# Patient Record
Sex: Female | Born: 1980 | Race: Black or African American | Hispanic: No | Marital: Married | Smoking: Never smoker
Health system: Southern US, Community
[De-identification: ages and names within clinical notes are randomized; demographics above are authoritative.]

## PROBLEM LIST (undated history)

## (undated) HISTORY — PX: TUBAL LIGATION: SHX77

---

## 2009-03-07 ENCOUNTER — Emergency Department: Payer: Self-pay | Admitting: Emergency Medicine

## 2009-08-06 ENCOUNTER — Ambulatory Visit: Payer: Self-pay | Admitting: Internal Medicine

## 2009-12-17 ENCOUNTER — Emergency Department: Payer: Self-pay | Admitting: Emergency Medicine

## 2010-04-18 ENCOUNTER — Ambulatory Visit: Payer: Self-pay | Admitting: Family Medicine

## 2010-10-05 ENCOUNTER — Ambulatory Visit: Payer: Self-pay

## 2016-09-13 ENCOUNTER — Encounter: Payer: Self-pay | Admitting: Emergency Medicine

## 2016-09-13 ENCOUNTER — Ambulatory Visit
Admission: EM | Admit: 2016-09-13 | Discharge: 2016-09-13 | Disposition: A | Discharge status: Home / Self Care | Payer: BLUE CROSS/BLUE SHIELD | Attending: Family Medicine | Admitting: Family Medicine

## 2016-09-13 ENCOUNTER — Ambulatory Visit (INDEPENDENT_AMBULATORY_CARE_PROVIDER_SITE_OTHER): Payer: BLUE CROSS/BLUE SHIELD

## 2016-09-13 DIAGNOSIS — S86312A Strain of muscle(s) and tendon(s) of peroneal muscle group at lower leg level, left leg, initial encounter: Secondary | ICD-10-CM | POA: Diagnosis not present

## 2016-09-13 DIAGNOSIS — M25572 Pain in left ankle and joints of left foot: Secondary | ICD-10-CM | POA: Diagnosis not present

## 2016-09-13 MED ORDER — NAPROXEN 500 MG PO TABS: 500.0000 mg | ORAL_TABLET | Freq: Two times a day (BID) | ORAL | 0 refills | Status: DC

## 2016-09-13 NOTE — ED Provider Notes (Signed)
CSN: [id number]     Arrival date & time 09/13/16  1536 History   First MD Initiated Contact with Patient 09/13/16 1611     Chief Complaint  Patient presents with  . Foot Pain   (Consider location/radiation/quality/duration/timing/severity/associated sxs/prior Treatment) HPI  This is a 36 year old female who has left lateral ankle pain and swelling that she's had for a few days. She states that she was sitting on the couch and just stood up and felt a pop that was painful over the lateral ankle. She states that now is very painful to walk on her foot. She has been elevating and icing but did still tends to swell and also remains painful. She does not remember any previous injury nor has she had increased activities  with running, prolonged standing, long walking, or increasing workout in the gym.         History reviewed. No pertinent past medical history. Past Surgical History:  Procedure Laterality Date  . TUBAL LIGATION Bilateral    Family History  Problem Relation Age of Onset  . Cancer Father   . Hypertension Mother    Social History  Substance Use Topics  . Smoking status: Never Smoker  . Smokeless tobacco: Never Used  . Alcohol use No   OB History    No data available     Review of Systems  Constitutional: Positive for activity change. Negative for appetite change, fatigue and fever.  Musculoskeletal: Positive for gait problem and myalgias.  All other systems reviewed and are negative.   Allergies  Sulfa antibiotics  Home Medications   Prior to Admission medications   Medication Sig Start Date End Date Taking? Authorizing Provider  naproxen (NAPROSYN) 500 MG tablet Take 1 tablet (500 mg total) by mouth 2 (two) times daily with a meal. 09/13/16   Lutricia Feiloemer, Berdie Malter P, PA-C   Meds Ordered and Administered this Visit  Medications - No data to display  BP 117/62 (BP Location: Left Arm)   Pulse 81   Temp 98.2 F (36.8 C) (Oral)   Resp 18   Ht 5\' 2"  (1.575 m)    Wt 141 lb 1.5 oz (64 kg)   LMP 08/29/2016 (Approximate)   SpO2 100%   BMI 25.81 kg/m  No data found.   Physical Exam  Constitutional: She is oriented to person, place, and time. She appears well-developed and well-nourished. No distress.  HENT:  Head: Normocephalic.  Eyes: Pupils are equal, round, and reactive to light.  Neck: Normal range of motion.  Musculoskeletal: She exhibits edema and tenderness. She exhibits no deformity.  Examination of the left foot and ankle shows no discomfort with compression of the distal tib-fib. Patient does not have any tenderness on the medial aspect of her ankle along the tarsals or metatarsals. However laterally the patient has pain beginning just superior to the lateral malleolus posteriorly along the malleolus and extending into the base of the fifth metatarsal. There is swelling present. There is no ecchymosis present. She also has tenderness over the distal fibula. Range of motion is decreased particularly to eversion inversion at the extremes plantar flexion and dorsiflexion all which cause of lateral ankle pain. Motion of the subtalar joint to eversion does cause her discomfort.  Neurological: She is alert and oriented to person, place, and time.  Skin: Skin is warm and dry. She is not diaphoretic.  Psychiatric: She has a normal mood and affect. Her behavior is normal. Judgment and thought content normal.  Nursing note and  vitals reviewed.   Urgent Care Course     Procedures (including critical care time)  Labs Review Labs Reviewed - No data to display  Imaging Review Dg Ankle Complete Left  Result Date: 09/13/2016 CLINICAL DATA:  Patient states foot popped a few days ago and is swollen and painful to the touch. EXAM: LEFT ANKLE COMPLETE - 3+ VIEW COMPARISON:  None. FINDINGS: There is no evidence of fracture, dislocation, or joint effusion. Lateral soft tissue swelling swelling. Ankle mortise is intact. Visualized bones of the foot  demonstrate no acute abnormality. IMPRESSION: Lateral soft tissue swelling.  No visible fracture. Electronically Signed   By: Elsie Stain M.D.   On: 09/13/2016 16:58     Visual Acuity Review  Right Eye Distance:   Left Eye Distance:   Bilateral Distance:    Right Eye Near:   Left Eye Near:    Bilateral Near:     Patient was given a boot orthosis which helped with her ambulation immensely    MDM   1. Strain of peroneal tendon of left foot, initial encounter    New Prescriptions   NAPROXEN (NAPROSYN) 500 MG TABLET    Take 1 tablet (500 mg total) by mouth 2 (two) times daily with a meal.  Plan: 1. Test/x-ray results and diagnosis reviewed with patient 2. rx as per orders; risks, benefits, potential side effects reviewed with patient 3. Recommend supportive treatment with Use of the boot orthosis during active times. Avoid symptoms and rest the ankle as much as possible. Use ice and elevation to control swelling. If it is not improving in 2 weeks follow-up with the podiatrist. 4. F/u prn if symptoms worsen or don't improve     Lutricia Feil, PA-C 09/13/16 1724

## 2016-09-13 NOTE — ED Triage Notes (Signed)
Patient states her foot "popped" a few days ago and is swollen and painful to put much weight on.

## 2018-01-01 ENCOUNTER — Ambulatory Visit
Admission: EM | Admit: 2018-01-01 | Discharge: 2018-01-01 | Disposition: A | Discharge status: Home / Self Care | Payer: Self-pay | Attending: Emergency Medicine | Admitting: Emergency Medicine

## 2018-01-01 ENCOUNTER — Encounter: Payer: Self-pay | Admitting: Emergency Medicine

## 2018-01-01 ENCOUNTER — Other Ambulatory Visit: Payer: Self-pay

## 2018-01-01 DIAGNOSIS — N3001 Acute cystitis with hematuria: Secondary | ICD-10-CM

## 2018-01-01 LAB — URINALYSIS, COMPLETE (UACMP) WITH MICROSCOPIC
Bilirubin Urine: NEGATIVE
Glucose, UA: NEGATIVE mg/dL
Ketones, ur: NEGATIVE mg/dL
Nitrite: POSITIVE — AB
PH: 6 (ref 5.0–8.0)
Specific Gravity, Urine: 1.02 (ref 1.005–1.030)

## 2018-01-01 MED ORDER — NITROFURANTOIN MONOHYD MACRO 100 MG PO CAPS: 100.0000 mg | ORAL_CAPSULE | Freq: Two times a day (BID) | ORAL | 0 refills | Status: AC

## 2018-01-01 MED ORDER — PHENAZOPYRIDINE HCL 200 MG PO TABS: 200.0000 mg | ORAL_TABLET | Freq: Three times a day (TID) | ORAL | 0 refills | Status: AC | PRN

## 2018-01-01 NOTE — ED Triage Notes (Signed)
Patient c/o urinary frequency and urgency that started x 1 week.

## 2018-01-01 NOTE — Discharge Instructions (Addendum)
Push fluids, pyridium will help with the symptoms and the macrobid will treat the infection.   Here is a list of primary care providers who are taking new patients:  Dr. Elizabeth Sauer, Dr. Schuyler Amor [address] [place] [address] [phone]  [facility] [address] [place] [address]  [phone]  [facility] [address] [place], [place] [address] [phone]  [facility] [address] [place]  [phone] [place], [place] [address]  Here are clinics/ other resources who will see you if you do not have insurance. Some have certain criteria that you must meet. Call them and find out what they are:  [facility]: [address], [place], [place] [address] Phone: [phone] Hours: First and Third Saturdays of each Month, 9 a.m. - 1 p.m.  [facility]: [address], [address] [place], [place] [address] Phone: [phone] Hours: Tuesday, 4 p.m. - 8 p.m. Thursday, 1 p.m. - 8 p.m. Wednesday, 9 a.m. - [facility] [address] [place], [place] [address] Phone: [phone] Pharmacy Phone Number: [phone] Dental Phone Number: [phone] [facility] Insurance Help: [phone]  Dental Hours: Monday - Thursday, 8 a.m. - 6 p.m.  [facility] [address] [place], [place] [address] Phone: [phone] Pharmacy Phone Number: [phone] [facility] Insurance Help: [phone]  [facility] [address] [place], [place] [address] Phone: [phone] Pharmacy Phone Number: [phone] [facility] Insurance Help: [phone]  [facility] [address] [place], [place] [address] Phone: [phone] [facility] Insurance Help: [phone]   [facility] [address], [place], [place] [address] Phone: [phone]  Go to www.goodrx.com to look up your medications. This will give you a list of where you can  find your prescriptions at the most affordable prices. Or ask the pharmacist what the cash price is, or if they have any other discount programs available to help make your medication more affordable. This can be less expensive than what you would pay with insurance.

## 2018-01-01 NOTE — ED Provider Notes (Signed)
HPI  SUBJECTIVE:  Candice Graham is a 37 y.o. female who presents with urinary urgency, frequency, odorous urine.  She reports bladder spasms after urinating.  This is been going on for a week.  She denies dysuria, hematuria, fevers.  No pelvic, back, new or different abdominal pain.  No vaginal bleeding, odor, genital rash, vaginal discharge.  She states that she has not been sexually active in over a year.  No antibiotics in the past month.  No antipyretic in the past 4 to 6 hours.  She has tried pushing fluids without improvement in her symptoms, symptoms are worse with urination.  She has a past medical history of UTIs.  No history of pyelonephritis, nephrolithiasis, diabetes, hypertension, BV, yeast, STDs.  LMP: 3 weeks ago.  PMD: None.    History reviewed. No pertinent past medical history.  Past Surgical History:  Procedure Laterality Date  . TUBAL LIGATION Bilateral     Family History  Problem Relation Age of Onset  . Cancer Father   . Hypertension Mother     Social History   Tobacco Use  . Smoking status: Never Smoker  . Smokeless tobacco: Never Used  Substance Use Topics  . Alcohol use: No  . Drug use: No    No current facility-administered medications for this encounter.   Current Outpatient Medications:  .  nitrofurantoin, macrocrystal-monohydrate, (MACROBID) 100 MG capsule, Take 1 capsule (100 mg total) by mouth 2 (two) times daily. X 5 days, Disp: 10 capsule, Rfl: 0 .  phenazopyridine (PYRIDIUM) 200 MG tablet, Take 1 tablet (200 mg total) by mouth 3 (three) times daily as needed for pain., Disp: 6 tablet, Rfl: 0  Allergies  Allergen Reactions  . Sulfa Antibiotics      ROS  As noted in HPI.   Physical Exam  BP 114/76 (BP Location: Right Arm)   Pulse 73   Temp 98.2 F (36.8 C) (Oral)   Resp 18   Ht 5\' 3"  (1.6 m)   Wt 62.6 kg   LMP 12/11/2017   SpO2 100%   BMI 24.45 kg/m   Constitutional: Well developed, well nourished, no acute  distress Eyes:  EOMI, conjunctiva normal bilaterally HENT: Normocephalic, atraumatic,mucus membranes moist Respiratory: Normal inspiratory effort Cardiovascular: Normal rate GI: nondistended and soft.  Positive suprapubic tenderness.  Mild right flank tenderness.  No left flank tenderness. Neck: No CVAT skin: No rash, skin intact Musculoskeletal: no deformities Neurologic: Alert & oriented x 3, no focal neuro deficits Psychiatric: Speech and behavior appropriate   ED Course   Medications - No data to display  Orders Placed This Encounter  Procedures  . Urine culture    Standing Status:   Standing    Number of Occurrences:   1    Order Specific Question:   List patient's active antibiotics    Answer:   macrobid    Order Specific Question:   Patient immune status    Answer:   Normal  . Urinalysis, Complete w Microscopic    Standing Status:   Standing    Number of Occurrences:   1    Results for orders placed or performed during the hospital encounter of 01/01/18 (from the past 24 hour(s))  Urinalysis, Complete w Microscopic     Status: Abnormal   Collection Time: 01/01/18  6:11 PM  Result Value Ref Range   Color, Urine YELLOW YELLOW   APPearance HAZY (A) CLEAR   Specific Gravity, Urine 1.020 1.005 - 1.030  pH 6.0 5.0 - 8.0   Glucose, UA NEGATIVE NEGATIVE mg/dL   Hgb urine dipstick TRACE (A) NEGATIVE   Bilirubin Urine NEGATIVE NEGATIVE   Ketones, ur NEGATIVE NEGATIVE mg/dL   Protein, ur TRACE (A) NEGATIVE mg/dL   Nitrite POSITIVE (A) NEGATIVE   Leukocytes, UA MODERATE (A) NEGATIVE   Squamous Epithelial / LPF 0-5 0 - 5   WBC, UA 21-50 0 - 5 WBC/hpf   RBC / HPF 0-5 0 - 5 RBC/hpf   Bacteria, UA MANY (A) NONE SEEN   No results found.  ED Clinical Impression  Acute cystitis with hematuria   ED Assessment/Plan  Pt With a UTI.  Sending home on Macrobid, Pyridium, push fluids.  Sending urine off for culture to confirm antibiotic choice.  Advised patient that we will  call her if we need to change her antibiotics.  She will follow-up here if she is not better after finishing her antibiotics or she may follow-up with a primary care physician of her choice, gave her primary care referral list.  Discussed labs, MDM, treatment plan, and plan for follow-up with patient. Discussed sn/sx that should prompt return to the ED. patient agrees with plan.   Meds ordered this encounter  Medications  . phenazopyridine (PYRIDIUM) 200 MG tablet    Sig: Take 1 tablet (200 mg total) by mouth 3 (three) times daily as needed for pain.    Dispense:  6 tablet    Refill:  0  . nitrofurantoin, macrocrystal-monohydrate, (MACROBID) 100 MG capsule    Sig: Take 1 capsule (100 mg total) by mouth 2 (two) times daily. X 5 days    Dispense:  10 capsule    Refill:  0    *This clinic note was created using Scientist, clinical (histocompatibility and immunogenetics). Therefore, there may be occasional mistakes despite careful proofreading.   ?   Domenick Gong, MD 01/01/18 2054

## 2018-01-04 LAB — URINE CULTURE
Culture: >=100000 — AB
SPECIAL REQUESTS: NORMAL

## 2019-06-20 ENCOUNTER — Ambulatory Visit: Payer: Self-pay

## 2019-06-22 ENCOUNTER — Ambulatory Visit: Payer: Self-pay | Attending: Oncology

## 2019-06-22 DIAGNOSIS — Z23 Encounter for immunization: Secondary | ICD-10-CM

## 2019-06-22 NOTE — Progress Notes (Signed)
   Covid-19 Vaccination Clinic  Name:  Candice Graham    MRN: [id number] DOB: 1981/01/07  06/22/2019  Ms. Candice Graham was observed post Covid-19 immunization for 30 minutes based on pre-vaccination screening without incident. She was provided with Vaccine Information Sheet and instruction to access the V-Safe system.   Ms. Candice Graham was instructed to call 911 with any severe reactions post vaccine: Marland Kitchen Difficulty breathing  . Swelling of face and throat  . A fast heartbeat  . A bad rash all over body  . Dizziness and weakness   Immunizations Administered    Name Date Dose VIS Date Route   Pfizer COVID-19 Vaccine 06/22/2019  8:24 AM 0.3 mL 04/17/2018 Intramuscular   Manufacturer: [employer], [employer]   Lot: [id number]   NDC: [address]-9

## 2019-07-16 ENCOUNTER — Ambulatory Visit: Payer: Self-pay | Attending: Internal Medicine

## 2019-07-16 DIAGNOSIS — Z23 Encounter for immunization: Secondary | ICD-10-CM

## 2019-07-16 NOTE — Progress Notes (Signed)
   Covid-19 Vaccination Clinic  Name:  Candice Graham    MRN: [id number] DOB: [birth year]  07/16/2019  Ms. Candice Graham was observed post Covid-19 immunization for 30 minutes based on pre-vaccination screening without incident. She was provided with Vaccine Information Sheet and instruction to access the V-Safe system.   Ms. Candice Graham was instructed to call 911 with any severe reactions post vaccine: Marland Kitchen Difficulty breathing  . Swelling of face and throat  . A fast heartbeat  . A bad rash all over body  . Dizziness and weakness   Immunizations Administered    Name Date Dose VIS Date Route   Pfizer COVID-19 Vaccine 07/16/2019  8:36 AM 0.3 mL 04/17/2018 Intramuscular   Manufacturer: [employer], [employer]   Lot: [id number]   NDC: [address]-3

## 2020-09-15 ENCOUNTER — Other Ambulatory Visit: Payer: Self-pay

## 2020-09-15 ENCOUNTER — Ambulatory Visit
Admission: EM | Admit: 2020-09-15 | Discharge: 2020-09-15 | Disposition: A | Discharge status: Home / Self Care | Payer: 59 | Attending: Sports Medicine | Admitting: Sports Medicine

## 2020-09-15 DIAGNOSIS — L299 Pruritus, unspecified: Secondary | ICD-10-CM | POA: Diagnosis not present

## 2020-09-15 DIAGNOSIS — T7840XA Allergy, unspecified, initial encounter: Secondary | ICD-10-CM

## 2020-09-15 DIAGNOSIS — M7989 Other specified soft tissue disorders: Secondary | ICD-10-CM

## 2020-09-15 DIAGNOSIS — L539 Erythematous condition, unspecified: Secondary | ICD-10-CM

## 2020-09-15 MED ORDER — PREDNISONE 10 MG PO TABS: 10.0000 mg | ORAL_TABLET | Freq: Every morning | ORAL | 0 refills | Status: AC

## 2020-09-15 MED ORDER — HYDROXYZINE HCL 25 MG PO TABS: 25.0000 mg | ORAL_TABLET | Freq: Four times a day (QID) | ORAL | 0 refills | Status: AC

## 2020-09-15 NOTE — Discharge Instructions (Addendum)
As we discussed, you may well have had an allergic reaction to medication for your headaches.  It is a good sign that he responded to [employer]. I gave you a 6-day prednisone taper. You can supplement that with an antihistamine such as Benadryl, Zyrtec, Allegra.  I also prescribed something for your itchiness that is nonsedating and you can take that. Please see educational handouts. If your symptoms persist please see your primary care provider. If your symptoms worsen and you get any symptoms such as throat closing, difficulty breathing, difficulty swallowing, please call 911 and go directly to the nearest emergency room. I gave you a work note just saying you were seen today.

## 2020-09-15 NOTE — ED Triage Notes (Signed)
Patient states that she has been taking fiorcet for a few days. States that she feels like she has been having an allergic reaction. States that she woke up with hands and arms itchy.

## 2020-09-17 NOTE — ED Provider Notes (Signed)
[facility]    CSN: [id number] Arrival date & time: 09/15/20  9767      History   Chief Complaint Chief Complaint  Patient presents with   Allergic Reaction    HPI Candice Graham is a 40 y.o. female.   Patient is a 40 year old female who presents for evaluation of acute onset of a rash on the palmar aspect of both hands with associated swelling and pruritus.  She says she woke up this morning with the symptoms.  Her swelling has improved.  The rash is also improved.  She thinks it secondary to a new medicine that she started for migraine headaches called Fioricet.  She did stop taking it.  She denies any chest pain shortness of breath.  No difficulty swallowing or wheezing.  No difficulty breathing.  No overt signs of anaphylaxis.  No red flag signs or symptoms elicited on history.  She reports no other symptoms other than what has been described in the HPI.   History reviewed. No pertinent past medical history.  There are no problems to display for this patient.   Past Surgical History:  Procedure Laterality Date   TUBAL LIGATION Bilateral     OB History   No obstetric history on file.      Home Medications    Prior to Admission medications   Medication Sig Start Date End Date Taking? Authorizing Provider  hydrOXYzine (ATARAX/VISTARIL) 25 MG tablet Take 1 tablet (25 mg total) by mouth every 6 (six) hours. 09/15/20  Yes Delton See, MD  predniSONE (DELTASONE) 10 MG tablet Take 1 tablet (10 mg total) by mouth AC breakfast for 6 days. 6 tablets on day 1, 5 tablets on day 2, 4 tablets on day 3, 3 tablets on day 4, 2 tablets on day 5, and 1 tablet on day 6 09/15/20 09/21/20 Yes Delton See, MD  butalbital-acetaminophen-caffeine (FIORICET) 50-325-40 MG tablet Take 1 tablet by mouth every 4 (four) hours as needed. 09/08/20   [provider]  nitrofurantoin, macrocrystal-monohydrate, (MACROBID) 100 MG capsule Take 1 capsule (100 mg total) by  mouth 2 (two) times daily. X 5 days 01/01/18   Domenick Gong, MD  phenazopyridine (PYRIDIUM) 200 MG tablet Take 1 tablet (200 mg total) by mouth 3 (three) times daily as needed for pain. 01/01/18   Domenick Gong, MD    Family History Family History  Problem Relation Age of Onset   Cancer Father    Hypertension Mother     Social History Social History   Tobacco Use   Smoking status: Never   Smokeless tobacco: Never  Vaping Use   Vaping Use: Never used  Substance Use Topics   Alcohol use: No   Drug use: No     Allergies   Sulfa antibiotics   Review of Systems Review of Systems  Constitutional:  Negative for activity change, appetite change, chills, diaphoresis, fatigue and fever.  HENT:  Negative for congestion, ear pain, postnasal drip, rhinorrhea, sinus pressure, sinus pain, sneezing, sore throat and trouble swallowing.   Eyes:  Negative for photophobia, pain and visual disturbance.  Respiratory:  Negative for cough, chest tightness, shortness of breath and wheezing.   Cardiovascular:  Negative for chest pain and palpitations.  Gastrointestinal:  Negative for abdominal pain, diarrhea, nausea and vomiting.  Genitourinary:  Negative for dysuria.  Musculoskeletal:  Positive for joint swelling. Negative for back pain, myalgias, neck pain and neck stiffness.  Skin:  Positive for rash. Negative for color change,  pallor and wound.  Allergic/Immunologic: Negative for environmental allergies and food allergies.  Neurological:  Negative for dizziness, seizures, syncope, light-headedness, numbness and headaches.  All other systems reviewed and are negative.   Physical Exam Triage Vital Signs ED Triage Vitals  Enc Vitals Group     BP 09/15/20 1028 119/64     Pulse Rate 09/15/20 1028 69     Resp 09/15/20 1028 18     Temp 09/15/20 1028 98.6 F (37 C)     Temp Source 09/15/20 1028 Oral     SpO2 09/15/20 1028 100 %     Weight 09/15/20 1026 143 lb (64.9 kg)     Height  09/15/20 1026 5\' 3"  (1.6 m)     Head Circumference --      Peak Flow --      Pain Score 09/15/20 1025 5     Pain Loc --      Pain Edu? --      Excl. in GC? --    No data found.  Updated Vital Signs BP 119/64 (BP Location: Right Arm)   Pulse 69   Temp 98.6 F (37 C) (Oral)   Resp 18   Ht 5\' 3"  (1.6 m)   Wt 64.9 kg   LMP 12/11/2017   SpO2 100%   BMI 25.33 kg/m   Visual Acuity Right Eye Distance:   Left Eye Distance:   Bilateral Distance:    Right Eye Near:   Left Eye Near:    Bilateral Near:     Physical Exam Constitutional:      Appearance: Normal appearance.  HENT:     Head: Normocephalic and atraumatic.     Nose: Nose normal.     Mouth/Throat:     Mouth: Mucous membranes are moist.  Eyes:     General: No scleral icterus.       Right eye: No discharge.        Left eye: No discharge.     Extraocular Movements: Extraocular movements intact.     Conjunctiva/sclera: Conjunctivae normal.     Pupils: Pupils are equal, round, and reactive to light.  Cardiovascular:     Rate and Rhythm: Normal rate and regular rhythm.     Pulses: Normal pulses.     Heart sounds: Normal heart sounds. No murmur heard.   No friction rub. No gallop.  Pulmonary:     Effort: Pulmonary effort is normal.     Breath sounds: Normal breath sounds. No stridor. No wheezing, rhonchi or rales.  Musculoskeletal:     Cervical back: Normal range of motion and neck supple.     Comments: Mild  bilateral palmar erythema and mild selling in hands and fingers  Skin:    General: Skin is warm and dry.     Capillary Refill: Capillary refill takes less than 2 seconds.     Coloration: Skin is not jaundiced.     Findings: Erythema present. No abrasion, abscess, ecchymosis or lesion.     Comments: Hand swelling bilaterally (mild)  Neurological:     General: No focal deficit present.     Mental Status: She is alert and oriented to person, place, and time.     UC Treatments / Results  Labs (all labs  ordered are listed, but only abnormal results are displayed) Labs Reviewed - No data to display  EKG   Radiology No results found.  Procedures Procedures (including critical care time)  Medications Ordered in UC Medications - No  data to display  Initial Impression / Assessment and Plan / UC Course  I have reviewed the triage vital signs and the nursing notes.  Pertinent labs & imaging results that were available during my care of the patient were reviewed by me and considered in my medical decision making (see chart for details).  Clinical impression: 1.  Bilateral hand swelling that is mild on presentation.  Per history it seems that it was a little worse this morning. 2.  Bilateral hand rash with just some mild erythema on examination here in the clinic. 3.  Allergic reaction to a drug. 4.  Pruritus.  Treatment plan: 1.  The findings and treatment plan were discussed in detail with the patient.  Patient was in agreement. 2.  She did take an Allegra this morning and that seemed to help her symptoms immensely.  That is a good sign. 3.  Went ahead and prescribed her 6-day prednisone taper. 4.  Encouraged her to continue with the Allegra and she can also supplement and some Zyrtec or Benadryl for the antihistamine response. 5.  Sent in a prescription for Atarax to help her with itchiness throughout the day as it is nonsedating. 6.  Educational handouts provided. 7.  Stop the Fioricet just in case this is a drug reaction. 8.  Encouraged her to reach out to her primary care provider to see if there is some altered additives for her headaches that she can take. 9.  Encouraged her to seek out care in the ER or call 911 if she was having any symptoms of anaphylaxis including but not limited to throat closing difficulty breathing, difficulty swallowing, or shortness of breath. 10.  Work note was provided saying she was seen today. 11.  She was discharged in stable condition and will  follow-up here as needed.    Final Clinical Impressions(s) / UC Diagnoses   Final diagnoses:  Allergic reaction, initial encounter  Allergic reaction to drug, initial encounter  Bilateral hand swelling  Pruritus  Hand erythema     Discharge Instructions      As we discussed, you may well have had an allergic reaction to medication for your headaches.  It is a good sign that he responded to [employer]. I gave you a 6-day prednisone taper. You can supplement that with an antihistamine such as Benadryl, Zyrtec, Allegra.  I also prescribed something for your itchiness that is nonsedating and you can take that. Please see educational handouts. If your symptoms persist please see your primary care provider. If your symptoms worsen and you get any symptoms such as throat closing, difficulty breathing, difficulty swallowing, please call 911 and go directly to the nearest emergency room. I gave you a work note just saying you were seen today.     ED Prescriptions     Medication Sig Dispense Auth. Provider   predniSONE (DELTASONE) 10 MG tablet Take 1 tablet (10 mg total) by mouth AC breakfast for 6 days. 6 tablets on day 1, 5 tablets on day 2, 4 tablets on day 3, 3 tablets on day 4, 2 tablets on day 5, and 1 tablet on day 6 21 tablet Delton See, MD   hydrOXYzine (ATARAX/VISTARIL) 25 MG tablet Take 1 tablet (25 mg total) by mouth every 6 (six) hours. 12 tablet Delton See, MD      PDMP not reviewed this encounter.   Delton See, MD 09/18/20 1504

## 2020-10-07 ENCOUNTER — Other Ambulatory Visit: Payer: Self-pay | Admitting: Physician Assistant

## 2020-10-07 DIAGNOSIS — R42 Dizziness and giddiness: Secondary | ICD-10-CM

## 2020-10-07 DIAGNOSIS — R202 Paresthesia of skin: Secondary | ICD-10-CM

## 2020-10-07 DIAGNOSIS — R519 Headache, unspecified: Secondary | ICD-10-CM

## 2020-10-07 DIAGNOSIS — R200 Anesthesia of skin: Secondary | ICD-10-CM

## 2020-10-14 ENCOUNTER — Other Ambulatory Visit: Payer: Self-pay

## 2020-10-14 ENCOUNTER — Ambulatory Visit
Admission: RE | Admit: 2020-10-14 | Discharge: 2020-10-14 | Disposition: A | Discharge status: Home / Self Care | Payer: 59 | Source: Ambulatory Visit | Attending: Physician Assistant | Admitting: Physician Assistant

## 2020-10-14 DIAGNOSIS — R200 Anesthesia of skin: Secondary | ICD-10-CM | POA: Insufficient documentation

## 2020-10-14 DIAGNOSIS — R202 Paresthesia of skin: Secondary | ICD-10-CM | POA: Diagnosis present

## 2020-10-14 DIAGNOSIS — R42 Dizziness and giddiness: Secondary | ICD-10-CM | POA: Insufficient documentation

## 2020-10-14 DIAGNOSIS — R519 Headache, unspecified: Secondary | ICD-10-CM | POA: Insufficient documentation

## 2020-10-14 MED ORDER — GADOBUTROL 1 MMOL/ML IV SOLN
6.0000 mL | Freq: Once | INTRAVENOUS | Status: AC | PRN
  Administered 2020-10-14: 6 mL via INTRAVENOUS

## 2022-03-11 IMAGING — MR MR HEAD WO/W CM
15 series · 48 of 48 positions shown · IV contrast (gadavist)
Comparison: None.

CLINICAL DATA: On and off headaches for the past 2-3 years.

EXAM:
MRI HEAD WITHOUT AND WITH CONTRAST
TECHNIQUE: Multiplanar, multiecho pulse sequences of the brain and surrounding
structures were obtained without and with intravenous contrast.
CONTRAST:  6mL GADAVIST GADOBUTROL 1 MMOL/ML IV SOLN

[Series 5: ax dwi_tracew · axial · 3.0mm · 0.65mm/px · z∈[-63,+85]mm · 3 of 48 slices shown]
[im 1/48]
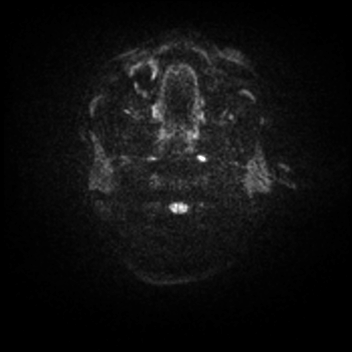
[im 24/48]
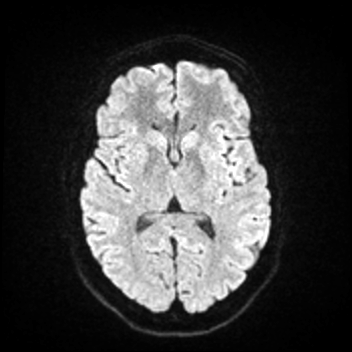
[im 48/48]
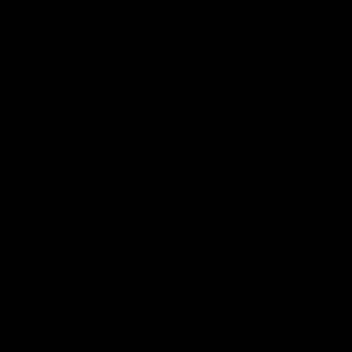

[Series 6: ax dwi_adc · axial · 3.0mm · 0.65mm/px · z∈[-63,+79]mm · 3 of 46 slices shown]
[im 1/46]
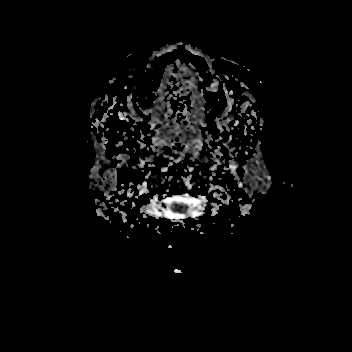
[im 23/46]
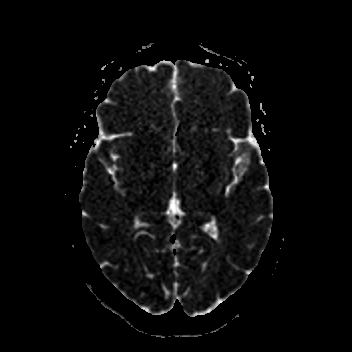
[im 46/46]
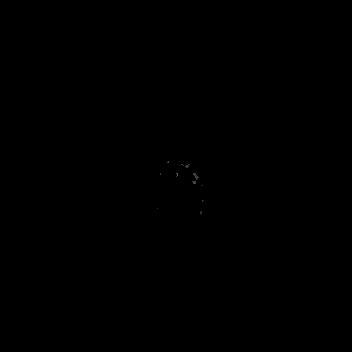

[Series 7: cor dwi_tracew · coronal · 5.0mm · 0.65mm/px · 2 of 36 slices shown]
[im 1/36]
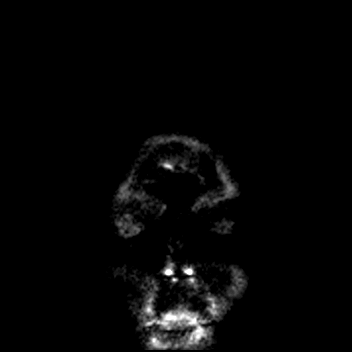
[im 36/36]
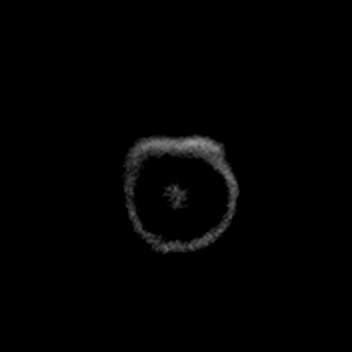

[Series 8: cor dwi_adc · coronal · 5.0mm · 0.65mm/px · 2 of 36 slices shown]
[im 1/36]
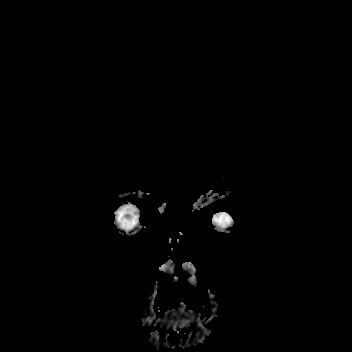
[im 36/36]
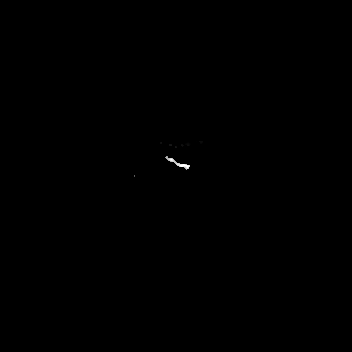

[Series 9: T1 · sagittal · 5.0mm · 0.62mm/px · 1 of 21 slices shown (1 of 2)]
[im 1/21]
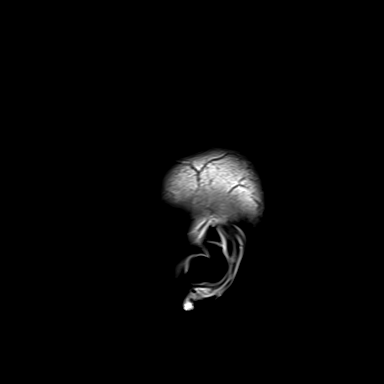

[Series 10: T2 · axial · 5.0mm · 0.53mm/px · 1 of 25 slices shown]
[im 1/25]
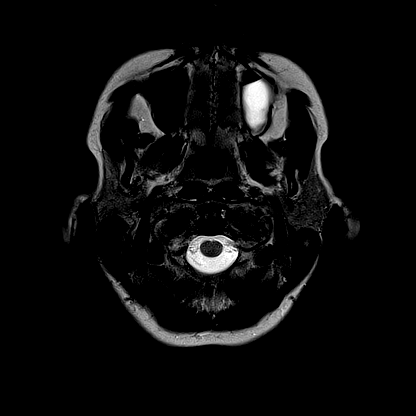

[Series 11: mag_images · axial · 3.0mm · 0.90mm/px · z∈[-75,+95]mm · 3 of 60 slices shown]
[im 1/60]
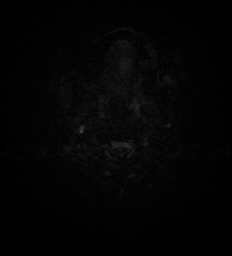
[im 30/60]
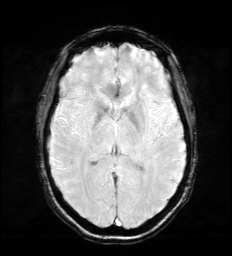
[im 60/60]
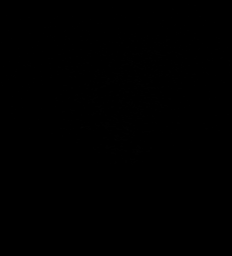

[Series 12: pha_images · axial · 3.0mm · 0.90mm/px · z∈[-75,+95]mm · 3 of 60 slices shown]
[im 1/60]
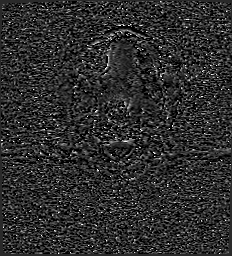
[im 30/60]
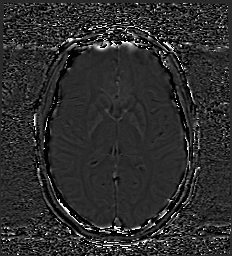
[im 60/60]
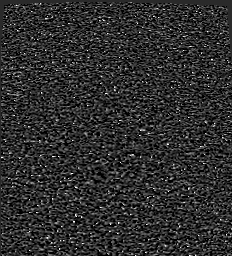

[Series 13: swi_images · axial · 3.0mm · 0.90mm/px · z∈[-75,+95]mm · 3 of 60 slices shown]
[im 1/60]
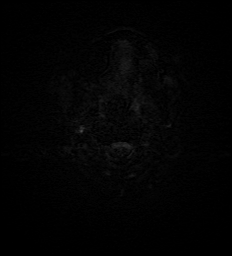
[im 30/60]
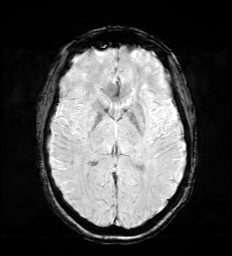
[im 60/60]
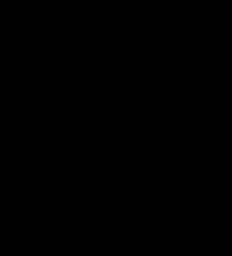

[Series 15: FLAIR · axial · 3.0mm · 0.53mm/px · z∈[-66,+90]mm · 3 of 55 slices shown (1 of 2)]
[im 1/55]
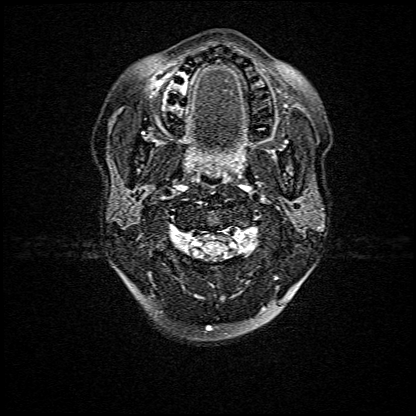
[im 28/55]
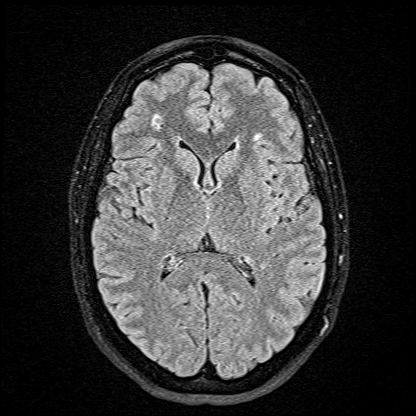
[im 55/55]
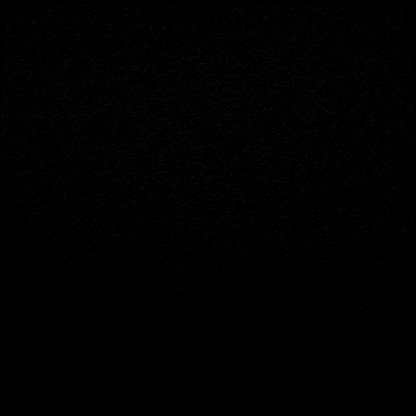

[Series 16: T1 · axial · 1.0mm · 0.98mm/px · z∈[-76,+90]mm · 10 of 174 slices shown (2 of 2)]
[im 1/174]
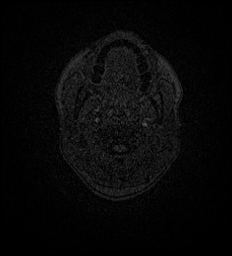
[im 20/174]
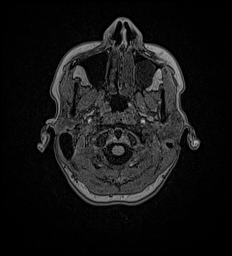
[im 39/174]
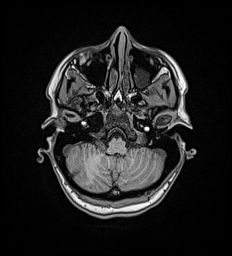
[im 58/174]
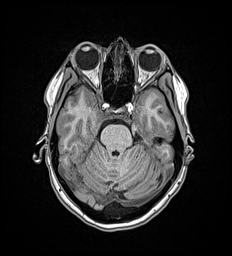
[im 77/174]
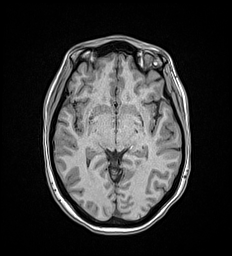
[im 97/174]
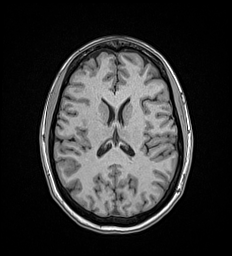
[im 116/174]
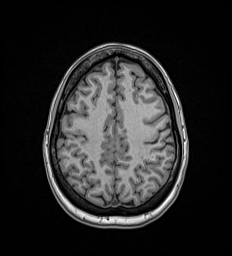
[im 135/174]
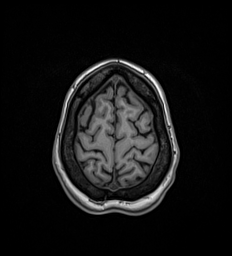
[im 154/174]
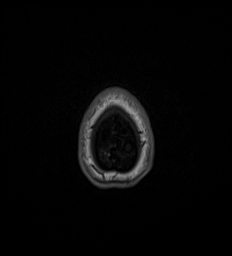
[im 174/174]
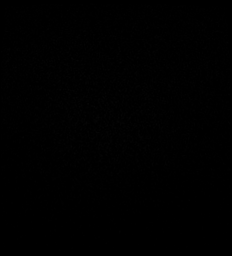

[Series 17: FLAIR · sagittal · 5.0mm · 0.94mm/px · 1 of 21 slices shown (2 of 2)]
[im 1/21]
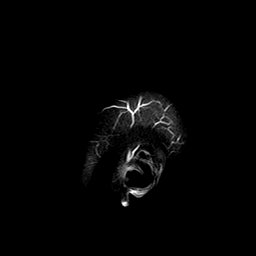

[Series 18: T2 post-contrast · coronal · 5.0mm · 0.57mm/px · 2 of 29 slices shown]
[im 1/29]
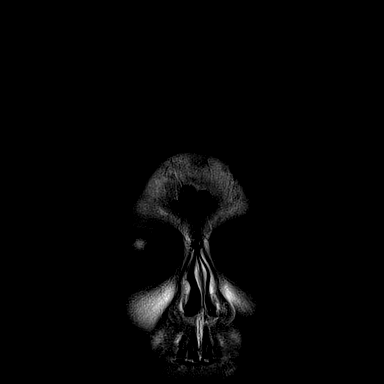
[im 29/29]
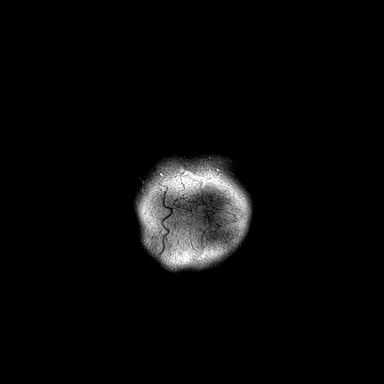

[Series 19: T1 post-contrast · axial · 1.0mm · 0.98mm/px · z∈[-76,+92]mm · 10 of 175 slices shown (1 of 2)]
[im 1/175]
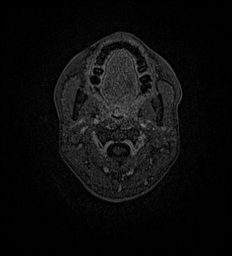
[im 20/175]
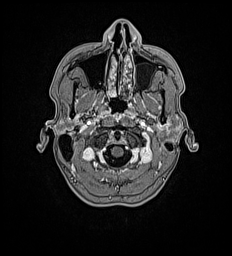
[im 39/175]
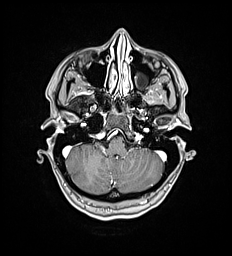
[im 59/175]
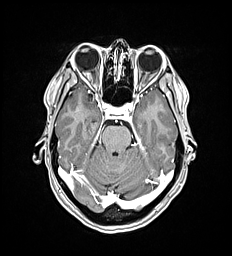
[im 78/175]
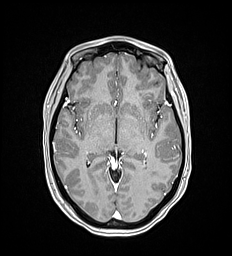
[im 97/175]
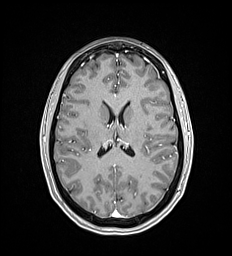
[im 117/175]
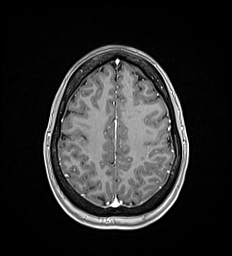
[im 136/175]
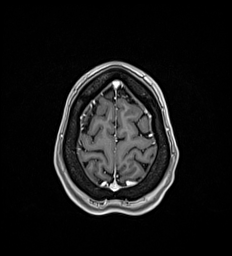
[im 155/175]
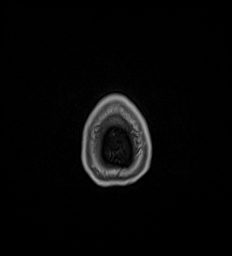
[im 175/175]
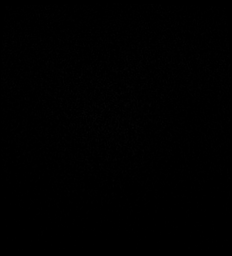

[Series 21: T1 post-contrast · sagittal · 5.0mm · 0.62mm/px · 1 of 21 slices shown (2 of 2)]
[im 1/21]
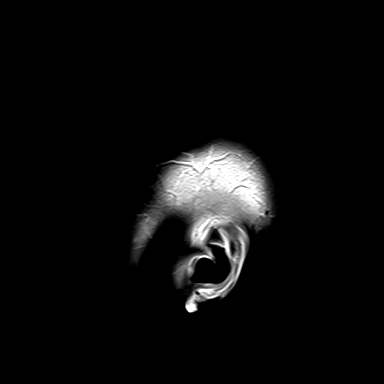

[48 of 48 positions shown; findings below may reference images not displayed]

FINDINGS: Brain: Small FLAIR hyperintensities in the cerebral white matter
without specific pattern. No periventricular predilection or
infratentorial involvement. At least 1 in the anterior right frontal
white matter is associated with a lacunar space. The areas of signal
abnormality measure at least 20. Brain volume is normal. No acute or
subacute infarct, hemorrhage, hydrocephalus, or collection.

Vascular: Normal flow voids and vascular enhancements.

Skull and upper cervical spine: Normal marrow signal

Sinuses/Orbits: Retention cysts in the left maxillary sinus which is
large.
IMPRESSION: Small remote cerebral white matter insults with nonspecific pattern.
Complicated migraine is considered given the patient's history.
Prior trauma, premature small vessel disease, or
inflammation/demyelination is also possible.
